# Patient Record
Sex: Female | Born: 1995 | Race: Black or African American | Hispanic: No | Marital: Single
Health system: Southern US, Community
[De-identification: ages and names within clinical notes are randomized; demographics above are authoritative.]

---

## 2018-12-21 ENCOUNTER — Emergency Department (HOSPITAL_COMMUNITY): Payer: PRIVATE HEALTH INSURANCE

## 2018-12-21 ENCOUNTER — Emergency Department (HOSPITAL_COMMUNITY)
Admission: EM | Admit: 2018-12-21 | Discharge: 2018-12-21 | Disposition: A | Discharge status: Home / Self Care | Payer: PRIVATE HEALTH INSURANCE | Attending: Emergency Medicine | Admitting: Emergency Medicine

## 2018-12-21 ENCOUNTER — Other Ambulatory Visit: Payer: Self-pay

## 2018-12-21 DIAGNOSIS — Y9389 Activity, other specified: Secondary | ICD-10-CM | POA: Diagnosis not present

## 2018-12-21 DIAGNOSIS — Y999 Unspecified external cause status: Secondary | ICD-10-CM | POA: Insufficient documentation

## 2018-12-21 DIAGNOSIS — Z23 Encounter for immunization: Secondary | ICD-10-CM | POA: Insufficient documentation

## 2018-12-21 DIAGNOSIS — S0242XA Fracture of alveolus of maxilla, initial encounter for closed fracture: Secondary | ICD-10-CM | POA: Insufficient documentation

## 2018-12-21 DIAGNOSIS — F10929 Alcohol use, unspecified with intoxication, unspecified: Secondary | ICD-10-CM | POA: Insufficient documentation

## 2018-12-21 DIAGNOSIS — Y92410 Unspecified street and highway as the place of occurrence of the external cause: Secondary | ICD-10-CM | POA: Diagnosis not present

## 2018-12-21 DIAGNOSIS — S0990XA Unspecified injury of head, initial encounter: Secondary | ICD-10-CM

## 2018-12-21 DIAGNOSIS — Y904 Blood alcohol level of 80-99 mg/100 ml: Secondary | ICD-10-CM | POA: Diagnosis not present

## 2018-12-21 DIAGNOSIS — T1490XA Injury, unspecified, initial encounter: Secondary | ICD-10-CM

## 2018-12-21 LAB — I-STAT BETA HCG BLOOD, ED (MC, WL, AP ONLY): I-stat hCG, quantitative: <5 m[IU]/mL (ref <5)

## 2018-12-21 LAB — COMPREHENSIVE METABOLIC PANEL
ALT: 14 U/L (ref 0–44)
AST: 23 U/L (ref 15–41)
Albumin: 4.2 g/dL (ref 3.5–5.0)
Alkaline Phosphatase: 81 U/L (ref 38–126)
Anion gap: 12 (ref 5–15)
BUN: 13 mg/dL (ref 6–20)
CO2: 19 mmol/L — ABNORMAL LOW (ref 22–32)
Calcium: 9.1 mg/dL (ref 8.9–10.3)
Chloride: 108 mmol/L (ref 98–111)
Creatinine, Ser: 0.7 mg/dL (ref 0.44–1.00)
GFR calc Af Amer: >60 mL/min (ref >60)
GFR calc non Af Amer: >60 mL/min (ref >60)
Glucose, Bld: 96 mg/dL (ref 70–99)
Potassium: 3.2 mmol/L — ABNORMAL LOW (ref 3.5–5.1)
Sodium: 139 mmol/L (ref 135–145)
Total Bilirubin: <0.1 mg/dL — ABNORMAL LOW (ref 0.3–1.2)
Total Protein: 7.2 g/dL (ref 6.5–8.1)

## 2018-12-21 LAB — LACTIC ACID, PLASMA: Lactic Acid, Venous: 3.5 mmol/L (ref 0.5–1.9)

## 2018-12-21 LAB — PROTIME-INR
INR: 1 (ref 0.8–1.2)
Prothrombin Time: 13.2 seconds (ref 11.4–15.2)

## 2018-12-21 LAB — CBC
HCT: 32 % — ABNORMAL LOW (ref 36.0–46.0)
Hemoglobin: 9.8 g/dL — ABNORMAL LOW (ref 12.0–15.0)
MCH: 27.1 pg (ref 26.0–34.0)
MCHC: 30.6 g/dL (ref 30.0–36.0)
MCV: 88.6 fL (ref 80.0–100.0)
Platelets: 311 10*3/uL (ref 150–400)
RBC: 3.61 MIL/uL — ABNORMAL LOW (ref 3.87–5.11)
RDW: 16.6 % — ABNORMAL HIGH (ref 11.5–15.5)
WBC: 12.9 10*3/uL — ABNORMAL HIGH (ref 4.0–10.5)
nRBC: 0 % (ref 0.0–0.2)

## 2018-12-21 LAB — CDS SEROLOGY

## 2018-12-21 LAB — I-STAT CHEM 8, ED
BUN: 14 mg/dL (ref 6–20)
Calcium, Ion: 1.19 mmol/L (ref 1.15–1.40)
Chloride: 106 mmol/L (ref 98–111)
Creatinine, Ser: 0.8 mg/dL (ref 0.44–1.00)
Glucose, Bld: 96 mg/dL (ref 70–99)
HCT: 34 % — ABNORMAL LOW (ref 36.0–46.0)
Hemoglobin: 11.6 g/dL — ABNORMAL LOW (ref 12.0–15.0)
Potassium: 3.1 mmol/L — ABNORMAL LOW (ref 3.5–5.1)
Sodium: 142 mmol/L (ref 135–145)
TCO2: 21 mmol/L — ABNORMAL LOW (ref 22–32)

## 2018-12-21 LAB — ETHANOL: Alcohol, Ethyl (B): 91 mg/dL — ABNORMAL HIGH (ref <10)

## 2018-12-21 LAB — SAMPLE TO BLOOD BANK

## 2018-12-21 MED ORDER — OXYCODONE-ACETAMINOPHEN 5-325 MG PO TABS: 1.0000 | ORAL_TABLET | ORAL | 0 refills | Status: DC | PRN

## 2018-12-21 MED ORDER — SODIUM CHLORIDE 0.9 % IV SOLN: INTRAVENOUS | Status: DC

## 2018-12-21 MED ORDER — AMOXICILLIN-POT CLAVULANATE 875-125 MG PO TABS: 1.0000 | ORAL_TABLET | Freq: Two times a day (BID) | ORAL | 0 refills | Status: AC

## 2018-12-21 MED ORDER — SODIUM CHLORIDE 0.9 % IV SOLN
INTRAVENOUS | Status: AC | PRN
  Administered 2018-12-21: 1000 mL via INTRAVENOUS

## 2018-12-21 MED ORDER — FENTANYL CITRATE (PF) 100 MCG/2ML IJ SOLN: 50.0000 ug | Freq: Once | INTRAMUSCULAR | Status: DC

## 2018-12-21 MED ORDER — IOHEXOL 350 MG/ML SOLN
100.0000 mL | Freq: Once | INTRAVENOUS | Status: AC | PRN
  Administered 2018-12-21: 100 mL via INTRAVENOUS

## 2018-12-21 MED ORDER — CHLORHEXIDINE GLUCONATE 0.12 % MT SOLN: 15.0000 mL | Freq: Two times a day (BID) | OROMUCOSAL | 0 refills | Status: AC

## 2018-12-21 MED ORDER — OXYCODONE-ACETAMINOPHEN 5-325 MG PO TABS: 1.0000 | ORAL_TABLET | ORAL | 0 refills | Status: AC | PRN

## 2018-12-21 MED ORDER — SODIUM CHLORIDE 0.9 % IV BOLUS (SEPSIS)
1000.0000 mL | Freq: Once | INTRAVENOUS | Status: AC
  Administered 2018-12-21: 1000 mL via INTRAVENOUS

## 2018-12-21 MED ORDER — TETANUS-DIPHTH-ACELL PERTUSSIS 5-2.5-18.5 LF-MCG/0.5 IM SUSP
0.5000 mL | Freq: Once | INTRAMUSCULAR | Status: AC
  Administered 2018-12-21: 0.5 mL via INTRAMUSCULAR
  Filled 2018-12-21: qty 0.5

## 2018-12-21 MED ORDER — ONDANSETRON 4 MG PO TBDP: 4.0000 mg | ORAL_TABLET | Freq: Four times a day (QID) | ORAL | 0 refills | Status: AC | PRN

## 2018-12-21 MED ORDER — AMOXICILLIN-POT CLAVULANATE 875-125 MG PO TABS
1.0000 | ORAL_TABLET | Freq: Once | ORAL | Status: AC
  Administered 2018-12-21: 1 via ORAL
  Filled 2018-12-21: qty 1

## 2018-12-21 NOTE — ED Notes (Signed)
Mouth wounds cleaned, foot wounds cleaned and covered per provider.
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
## 2018-12-21 NOTE — ED Notes (Signed)
Pt able to walk and tolerate PO fluids.  Provider aware.

## 2018-12-21 NOTE — ED Notes (Signed)
LEO questioning pt.
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
## 2018-12-21 NOTE — ED Notes (Signed)
O2 level decreased to 87%, placed on 2L [place]

## 2018-12-21 NOTE — ED Notes (Signed)
Called Dr. Leonides Schanz who verbally gave permission to go ahead w/ scans before preg test comes back.

## 2018-12-21 NOTE — Discharge Instructions (Addendum)
You have a fracture of your maxillary ridge and dental injury to the 2 upper teeth.  The tooth that came in with you with EMS is fractured and cannot be replaced in the ED.  You will need to follow-up with a dentist.  I recommend a soft diet for the next 3 to 4 days.  I recommend close follow-up as an outpatient and to continue her antibiotics for 1 week until complete.

## 2018-12-21 NOTE — ED Notes (Signed)
Called CT, informed Provider wanted pt scanned STAT, not waiting for labs to come back.  Pt to CT 2 now.

## 2020-02-01 ENCOUNTER — Ambulatory Visit: Payer: No Typology Code available for payment source | Attending: Chiropractic Medicine | Admitting: Physical Therapy

## 2020-02-01 ENCOUNTER — Other Ambulatory Visit: Payer: Self-pay

## 2020-02-01 ENCOUNTER — Encounter: Payer: Self-pay | Admitting: Physical Therapy

## 2020-02-01 DIAGNOSIS — M545 Low back pain, unspecified: Secondary | ICD-10-CM | POA: Insufficient documentation

## 2020-02-01 DIAGNOSIS — M62830 Muscle spasm of back: Secondary | ICD-10-CM | POA: Insufficient documentation

## 2020-02-01 NOTE — Patient Instructions (Signed)
Access Code: [email or web address] URL: [email or web address] Date: 02/01/2020 Prepared by: Stacie Glaze  Exercises Supine Lower Trunk Rotation - 2 x daily - 7 x weekly - 1 sets - 5 reps - 10 hold Supine Piriformis Stretch with Foot on Ground - 2 x daily - 7 x weekly - 1 sets - 5 reps - 10 hold Supine Piriformis Stretch Pulling Heel to Hip - 2 x daily - 7 x weekly - 1 sets - 5 reps - 10 hold Hooklying Hamstring Stretch with Strap - 2 x daily - 7 x weekly - 1 sets - 5 reps - 10 hold
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
## 2020-02-06 ENCOUNTER — Ambulatory Visit: Payer: No Typology Code available for payment source | Admitting: Physical Therapy

## 2020-02-06 ENCOUNTER — Encounter: Payer: Self-pay | Admitting: Physical Therapy

## 2020-02-06 ENCOUNTER — Other Ambulatory Visit: Payer: Self-pay

## 2020-02-06 DIAGNOSIS — M545 Low back pain, unspecified: Secondary | ICD-10-CM

## 2020-02-06 DIAGNOSIS — M62830 Muscle spasm of back: Secondary | ICD-10-CM

## 2020-02-06 NOTE — Therapy (Signed)
[facility] [place] [facility] [address] [place], [place], [address] Phone: [phone]   Fax:  [phone]  Physical Therapy Treatment  Patient Details  Name: Alexandra Barton MRN: [id number] Date of Birth: April 26, 1995 Referring Provider (PT): Mauri Reading   Encounter Date: 02/06/2020   PT End of Session - 02/06/20 1506    Visit Number 2    Date for PT Re-Evaluation 04/04/19    PT Start Time 1430    PT Stop Time 1510    PT Time Calculation (min) 40 min    Activity Tolerance Patient tolerated treatment well    Behavior During Therapy [facility] for tasks assessed/performed           History reviewed. No pertinent past medical history.  History reviewed. No pertinent surgical history.  There were no vitals filed for this visit.   Subjective Assessment - 02/06/20 1431    Subjective Doing fine today, no pain    Currently in Pain? No/denies                             [facility] Adult PT Treatment/Exercise - 02/06/20 0001      Exercises   Exercises Lumbar      Lumbar Exercises: Stretches   Passive Hamstring Stretch Right;Left;4 reps;10 seconds    Single Knee to Chest Stretch 4 reps;10 seconds;20 seconds;Right;Left    Lower Trunk Rotation 4 reps;10 seconds      Lumbar Exercises: Aerobic   Nustep L4 x6 min      Lumbar Exercises: Machines for Strengthening   Cybex Knee Extension 5lb 2x10    Cybex Knee Flexion 25lb 2x10      Lumbar Exercises: Standing   Row Theraband;20 reps;Both;Strengthening    Theraband Level (Row) Level 3 (Green)    Shoulder Extension Theraband;20 reps;Both;Strengthening    Theraband Level (Shoulder Extension) Level 3 (Green)    Other Standing Lumbar Exercises ER red 2x10                    PT Short Term Goals - 02/06/20 1509      PT SHORT TERM GOAL #1   Title independent with initial HEP    Status Achieved             PT Long Term Goals - 02/06/20 1509      PT LONG TERM GOAL #1    Title understand posture and body mechanics    Status On-going                 Plan - 02/06/20 1506    Clinical Impression Statement Pt tolerated an initial progression to TE well. Cues needed to complete the full ROM with seated curls and extensions. Tactile cues for posture needed with standing shoulder ext and rows. She reported an increase in low back discomfort when doing repetitive standing interventions. This low back discomfort went away after stretching.    Stability/Clinical Decision Making Stable/Uncomplicated    Rehab Potential Good    PT Frequency 2x / week    PT Duration 8 weeks    PT Treatment/Interventions ADLs/Self Care Home Management;Electrical Stimulation;Moist Heat;Neuromuscular re-education;Therapeutic exercise;Therapeutic activities;Patient/family education;Manual techniques;Dry needling    PT Next Visit Plan start gym activities for strength and flexibility           Patient will benefit from skilled therapeutic intervention in order to improve the following deficits and impairments:  Decreased range of motion,Pain,Impaired flexibility,Improper body  mechanics,Postural dysfunction,Decreased strength,Difficulty walking,Increased muscle spasms  Visit Diagnosis: Acute bilateral low back pain without sciatica  Muscle spasm of back     Problem List There are no problems to display for this patient.   Grayce Sessions, PTA 02/06/2020, 3:09 PM  [facility] [place] [facility] [address] [place], [place], [address] Phone: [phone]   Fax:  [phone]  Name: Alexandra Barton MRN: [id number] Date of Birth: [birth year]

## 2020-02-08 ENCOUNTER — Ambulatory Visit: Payer: No Typology Code available for payment source | Admitting: Physical Therapy

## 2020-02-08 ENCOUNTER — Other Ambulatory Visit: Payer: Self-pay

## 2020-02-08 ENCOUNTER — Encounter: Payer: Self-pay | Admitting: Physical Therapy

## 2020-02-08 DIAGNOSIS — M545 Low back pain, unspecified: Secondary | ICD-10-CM | POA: Diagnosis not present

## 2020-02-08 DIAGNOSIS — M62830 Muscle spasm of back: Secondary | ICD-10-CM

## 2020-02-08 NOTE — Therapy (Signed)
[facility] [address] [place], [place], [address] Phone: [phone]   Fax:  [phone]  Physical Therapy Treatment  Patient Details  Name: Keslee Harrington MRN: [id number] Date of Birth: May 25, 1995 Referring Provider (PT): Mauri Reading   Encounter Date: 02/08/2020   PT End of Session - 02/08/20 1506    Visit Number 3    Date for PT Re-Evaluation 04/04/19    PT Start Time 1430    PT Stop Time 1518    PT Time Calculation (min) 48 min    Activity Tolerance Patient tolerated treatment well    Behavior During Therapy [facility] for tasks assessed/performed           History reviewed. No pertinent past medical history.  History reviewed. No pertinent surgical history.  There were no vitals filed for this visit.   Subjective Assessment - 02/08/20 1432    Subjective Back was hurting yesterday, it feels ok    Currently in Pain? No/denies                             [facility] Adult PT Treatment/Exercise - 02/08/20 0001      Lumbar Exercises: Aerobic   UBE (Upper Arm Bike) L1 x 3 min    Nustep L4 x6 min      Lumbar Exercises: Machines for Strengthening   Other Lumbar Machine Exercise Rows & Lats 20lb 2x10      Lumbar Exercises: Standing   Other Standing Lumbar Exercises Shoulder Ext 5lb 2x10    Other Standing Lumbar Exercises standing marches 2x10      Lumbar Exercises: Seated   Sit to Stand 10 reps   chest press yellow ball x2     Modalities   Modalities Moist Heat      Moist Heat Therapy   Number Minutes Moist Heat 10 Minutes    Moist Heat Location Lumbar Spine                    PT Short Term Goals - 02/06/20 1509      PT SHORT TERM GOAL #1   Title independent with initial HEP    Status Achieved             PT Long Term Goals - 02/06/20 1509      PT LONG TERM GOAL #1   Title understand posture and body mechanics    Status On-going                 Plan - 02/08/20 1506     Clinical Impression Statement Progressed to some additional postural strengthening interventions. She did require tactile cues to prevent postural sway with seated rows. Cue to drive elbows down to her side with lat pull downs. Increase low back tightness reported with standing shoulder extensions. MHP to help with low back tightness.    Stability/Clinical Decision Making Stable/Uncomplicated    Rehab Potential Good    PT Frequency 2x / week    PT Duration 8 weeks    PT Treatment/Interventions ADLs/Self Care Home Management;Electrical Stimulation;Moist Heat;Neuromuscular re-education;Therapeutic exercise;Therapeutic activities;Patient/family education;Manual techniques;Dry needling    PT Next Visit Plan gym activities for strength and flexibility           Patient will benefit from skilled therapeutic intervention in order to improve the following deficits and impairments:  Decreased range of motion,Pain,Impaired flexibility,Improper body mechanics,Postural dysfunction,Decreased strength,Difficulty walking,Increased muscle spasms  Visit Diagnosis:  Muscle spasm of back  Acute bilateral low back pain without sciatica     Problem List There are no problems to display for this patient.   Grayce Sessions, PTA 02/08/2020, 3:09 PM  [facility] [place] [facility] [address] [place], [place], [address] Phone: [phone]   Fax:  [phone]  Name: Ertha Nabor MRN: [id number] Date of Birth: 1995/05/18

## 2020-02-13 ENCOUNTER — Encounter: Payer: Self-pay | Admitting: Physical Therapy

## 2020-02-13 ENCOUNTER — Other Ambulatory Visit: Payer: Self-pay

## 2020-02-13 ENCOUNTER — Ambulatory Visit: Payer: No Typology Code available for payment source | Admitting: Physical Therapy

## 2020-02-13 DIAGNOSIS — M545 Low back pain, unspecified: Secondary | ICD-10-CM | POA: Diagnosis not present

## 2020-02-13 DIAGNOSIS — M62830 Muscle spasm of back: Secondary | ICD-10-CM

## 2020-02-13 NOTE — Therapy (Signed)
[place]. [place], [place], [address] Phone: [phone]   Fax:  [phone]  Physical Therapy Treatment  Patient Details  Name: Alexandra Barton MRN: [id number] Date of Birth: 1995/08/31 Referring Provider (PT): Berta Minor   Encounter Date: 02/13/2020   PT End of Session - 02/13/20 1512    Visit Number 4    Date for PT Re-Evaluation 04/04/19    PT Start Time 1430    PT Stop Time 1513    PT Time Calculation (min) 43 min    Activity Tolerance Patient tolerated treatment well    Behavior During Therapy [facility] for tasks assessed/performed           History reviewed. No pertinent past medical history.  History reviewed. No pertinent surgical history.  There were no vitals filed for this visit.   Subjective Assessment - 02/13/20 1427    Subjective "Ok, my back hurt"    Currently in Pain? Yes    Pain Score 4     Pain Location Back                             [facility] Adult PT Treatment/Exercise - 02/13/20 0001      Lumbar Exercises: Stretches   Active Hamstring Stretch Right;Left;4 reps;10 seconds;2 reps;3 reps      Lumbar Exercises: Aerobic   UBE (Upper Arm Bike) L1 x 3 min    Nustep L4 x6 min      Lumbar Exercises: Machines for Strengthening   Other Lumbar Machine Exercise Rows & Lats 20lb 2x10      Lumbar Exercises: Standing   Shoulder Extension 20 reps;Both;Strengthening    Shoulder Extension Limitations 5    Other Standing Lumbar Exercises overhead Ext red ball 2x10    Other Standing Lumbar Exercises ER red 2x10; hip abd & Ext 2lb 2x10                    PT Short Term Goals - 02/06/20 1509      PT SHORT TERM GOAL #1   Title independent with initial HEP    Status Achieved             PT Long Term Goals - 02/13/20 1513      PT LONG TERM GOAL #1   Title understand posture and body mechanics    Status Partially Met      PT LONG TERM GOAL #2   Title increase  lumbar side bending to WNL's    Status On-going      PT LONG TERM GOAL #3   Title pain decreased 50%    Status On-going                 Plan - 02/13/20 1513    Clinical Impression Statement Pt able to complete today's interventions. She did report some increase in low back pain with standing hip extensions. Increase UE fatigue noted throughout session, pt would often stop during sets before completing all the reps. No issues with the addition of over head extension. R hamstring is tighter than L evident bu decrease ROM with active stretching.    Rehab Potential Good    PT Frequency 2x / week    PT Duration 8 weeks    PT Treatment/Interventions ADLs/Self Care Home Management;Electrical Stimulation;Moist Heat;Neuromuscular re-education;Therapeutic exercise;Therapeutic activities;Patient/family education;Manual techniques;Dry needling    PT Next Visit Plan gym activities for strength and  flexibility           Patient will benefit from skilled therapeutic intervention in order to improve the following deficits and impairments:  Decreased range of motion,Pain,Impaired flexibility,Improper body mechanics,Postural dysfunction,Decreased strength,Difficulty walking,Increased muscle spasms  Visit Diagnosis: Acute bilateral low back pain without sciatica  Muscle spasm of back     Problem List There are no problems to display for this patient.   Scot Jun, PTA 02/13/2020, 3:17 PM  [address]. [place], [place], [address] Phone: [phone]   Fax:  [phone]  Name: Alexandra Barton MRN: [id number] Date of Birth: March 02, 1995

## 2020-02-15 ENCOUNTER — Encounter: Payer: Self-pay | Admitting: Physical Therapy

## 2020-02-15 ENCOUNTER — Other Ambulatory Visit: Payer: Self-pay

## 2020-02-15 ENCOUNTER — Ambulatory Visit: Payer: No Typology Code available for payment source | Admitting: Physical Therapy

## 2020-02-15 DIAGNOSIS — M545 Low back pain, unspecified: Secondary | ICD-10-CM

## 2020-02-15 DIAGNOSIS — M62830 Muscle spasm of back: Secondary | ICD-10-CM

## 2020-02-15 NOTE — Therapy (Signed)
[facility] [place] [address] [place], [place], [address] Phone: [phone]   Fax:  [phone]  Physical Therapy Treatment  Patient Details  Name: Alexandra Barton MRN: [id number] Date of Birth: October 14, 1995 Referring Provider (PT): Mauri Reading   Encounter Date: 02/15/2020   PT End of Session - 02/15/20 1505    Visit Number 5    Date for PT Re-Evaluation 04/04/19    PT Start Time 1433    PT Stop Time 1515    PT Time Calculation (min) 42 min    Activity Tolerance Patient tolerated treatment well    Behavior During Therapy [facility] for tasks assessed/performed           History reviewed. No pertinent past medical history.  History reviewed. No pertinent surgical history.  There were no vitals filed for this visit.   Subjective Assessment - 02/15/20 1440    Subjective My upper back and neck area was really hurting may be the way I slept    Currently in Pain? Yes    Pain Score 5     Pain Location Back    Pain Orientation Upper    Pain Descriptors / Indicators Aching;Sore    Aggravating Factors  sleeping wrong                             [facility] Adult PT Treatment/Exercise - 02/15/20 0001      Lumbar Exercises: Aerobic   UBE (Upper Arm Bike) L2 x 4 min    Nustep L4 x6 min      Lumbar Exercises: Machines for Strengthening   Cybex Knee Extension 5lb 2x10    Cybex Knee Flexion 25lb 2x10    Other Lumbar Machine Exercise Rows & Lats 20lb 2x10      Lumbar Exercises: Standing   Row Theraband;20 reps;Both;Strengthening    Theraband Level (Row) Level 3 (Green)    Shoulder Extension 20 reps;Both;Strengthening    Theraband Level (Shoulder Extension) Level 3 (Green)    Other Standing Lumbar Exercises ER red 2x10; hip abd & Ext 2lb 2x10      Lumbar Exercises: Seated   Other Seated Lumbar Exercises on sit fit pelvic motbility and stabilty                    PT Short Term Goals - 02/06/20 1509      PT SHORT  TERM GOAL #1   Title independent with initial HEP    Status Achieved             PT Long Term Goals - 02/15/20 1508      PT LONG TERM GOAL #2   Title increase lumbar side bending to WNL's    Status On-going      PT LONG TERM GOAL #3   Title pain decreased 50%    Status On-going                 Plan - 02/15/20 1506    Clinical Impression Statement Patient with some incresae of low and upper back pain, with the exercises today gave a lot of cues for posture, form of the exercises and to slow down and get good repetitions.  She was tender in the upper traps and the rhomboid area today.    PT Next Visit Plan continue to add exercises for stability and flexibilty    Consulted and Agree with Plan of Care Patient  Patient will benefit from skilled therapeutic intervention in order to improve the following deficits and impairments:  Decreased range of motion,Pain,Impaired flexibility,Improper body mechanics,Postural dysfunction,Decreased strength,Difficulty walking,Increased muscle spasms  Visit Diagnosis: Acute bilateral low back pain without sciatica  Muscle spasm of back     Problem List There are no problems to display for this patient.   Jearld Lesch., PT 02/15/2020, 3:09 PM  [facility] [place] [address] [place], [place], [address] Phone: [phone]   Fax:  [phone]  Name: Alexandra Barton MRN: [id number] Date of Birth: [birth year]

## 2020-02-20 ENCOUNTER — Encounter: Payer: Self-pay | Admitting: Physical Therapy

## 2020-02-20 ENCOUNTER — Ambulatory Visit: Payer: No Typology Code available for payment source | Admitting: Physical Therapy

## 2020-02-20 ENCOUNTER — Other Ambulatory Visit: Payer: Self-pay

## 2020-02-20 DIAGNOSIS — M545 Low back pain, unspecified: Secondary | ICD-10-CM

## 2020-02-20 DIAGNOSIS — M62830 Muscle spasm of back: Secondary | ICD-10-CM

## 2020-02-20 NOTE — Therapy (Signed)
[facility] [place] [facility] [address]. [place], [place], [address] Phone: [phone]   Fax:  [phone]  Physical Therapy Treatment  Patient Details  Name: Alexandra Barton MRN: [id number] Date of Birth: 08-Oct-1995 Referring Provider (PT): Mauri Reading   Encounter Date: 02/20/2020   PT End of Session - 02/20/20 1508    Visit Number 6    Date for PT Re-Evaluation 04/04/19    PT Start Time 1430    PT Stop Time 1511    PT Time Calculation (min) 41 min    Activity Tolerance Patient tolerated treatment well    Behavior During Therapy [facility] for tasks assessed/performed           History reviewed. No pertinent past medical history.  History reviewed. No pertinent surgical history.  There were no vitals filed for this visit.   Subjective Assessment - 02/20/20 1432    Subjective Pt reports upper back pain    Currently in Pain? Yes    Pain Score 5     Pain Location Back    Pain Orientation Upper                             [facility] Adult PT Treatment/Exercise - 02/20/20 0001      Lumbar Exercises: Aerobic   UBE (Upper Arm Bike) L2 x 4 min    Nustep L4 x6 min      Lumbar Exercises: Machines for Strengthening   Cybex Knee Extension 5lb 2x10    Cybex Knee Flexion 25lb 2x10    Other Lumbar Machine Exercise Rows & Lats 25lb 2x10      Lumbar Exercises: Standing   Row Theraband;20 reps;Both;Strengthening    Theraband Level (Row) Level 3 (Green)    Shoulder Extension 20 reps;Both;Strengthening    Theraband Level (Shoulder Extension) Level 3 (Green)                    PT Short Term Goals - 02/06/20 1509      PT SHORT TERM GOAL #1   Title independent with initial HEP    Status Achieved             PT Long Term Goals - 02/15/20 1508      PT LONG TERM GOAL #2   Title increase lumbar side bending to WNL's    Status On-going      PT LONG TERM GOAL #3   Title pain decreased 50%    Status On-going                  Plan - 02/20/20 1510    Clinical Impression Statement All interventions completed well, but with signs of fatigue throughout. Pt enters clinic reporting upper back pain today. Cues required for posture and to complete the full avaliable ROM with the exercises. Some increase in mid back pain with shoulder extensions.    Stability/Clinical Decision Making Stable/Uncomplicated    Rehab Potential Good    PT Frequency 2x / week    PT Duration 8 weeks    PT Treatment/Interventions ADLs/Self Care Home Management;Electrical Stimulation;Moist Heat;Neuromuscular re-education;Therapeutic exercise;Therapeutic activities;Patient/family education;Manual techniques;Dry needling    PT Next Visit Plan continue to add exercises for stability and flexibility           Patient will benefit from skilled therapeutic intervention in order to improve the following deficits and impairments:  Decreased range of motion,Pain,Impaired flexibility,Improper body mechanics,Postural dysfunction,Decreased strength,Difficulty  walking,Increased muscle spasms  Visit Diagnosis: Muscle spasm of back  Acute bilateral low back pain without sciatica     Problem List There are no problems to display for this patient.   Grayce Sessions, PTA 02/20/2020, 3:14 PM  [facility] [place] [facility] [address] [place], [place], [address] Phone: [phone]   Fax:  [phone]  Name: Alexandra Barton MRN: [id number] Date of Birth: 1996-01-03

## 2020-02-22 ENCOUNTER — Encounter: Payer: Self-pay | Admitting: Physical Therapy

## 2020-02-22 ENCOUNTER — Ambulatory Visit: Payer: No Typology Code available for payment source | Admitting: Physical Therapy

## 2020-02-22 ENCOUNTER — Other Ambulatory Visit: Payer: Self-pay

## 2020-02-22 DIAGNOSIS — M545 Low back pain, unspecified: Secondary | ICD-10-CM | POA: Diagnosis not present

## 2020-02-22 DIAGNOSIS — M62830 Muscle spasm of back: Secondary | ICD-10-CM

## 2020-02-22 NOTE — Therapy (Signed)
[facility] [place] [address] [place], [place], [address] Phone: [phone]   Fax:  [phone]  Physical Therapy Treatment  Patient Details  Name: Lennon Richins MRN: [id number] Date of Birth: May 17, 1995 Referring Provider (PT): Mauri Reading   Encounter Date: 02/22/2020   PT End of Session - 02/22/20 1503    Visit Number 7    Date for PT Re-Evaluation 04/04/19    PT Start Time 1430    PT Stop Time 1503    PT Time Calculation (min) 33 min    Activity Tolerance Patient tolerated treatment well    Behavior During Therapy [facility] for tasks assessed/performed           History reviewed. No pertinent past medical history.  History reviewed. No pertinent surgical history.  There were no vitals filed for this visit.   Subjective Assessment - 02/22/20 1431    Subjective "Tired, stayed up late last night"    Currently in Pain? No/denies                             [facility] Adult PT Treatment/Exercise - 02/22/20 0001      Lumbar Exercises: Aerobic   UBE (Upper Arm Bike) L2 x 4 min    Nustep L4 x6 min      Lumbar Exercises: Machines for Strengthening   Other Lumbar Machine Exercise Rows & Lats 25lb 2x10, Chest press 10lb 2x10      Manual Therapy   Manual Therapy Passive ROM;Soft tissue mobilization    Manual therapy comments some tenderness in rhomboid area    Soft tissue mobilization Upper traps into rhomboid area    Passive ROM Cervical spine with end range stretching                    PT Short Term Goals - 02/06/20 1509      PT SHORT TERM GOAL #1   Title independent with initial HEP    Status Achieved             PT Long Term Goals - 02/15/20 1508      PT LONG TERM GOAL #2   Title increase lumbar side bending to WNL's    Status On-going      PT LONG TERM GOAL #3   Title pain decreased 50%    Status On-going                 Plan - 02/22/20 1503    Clinical Impression  Statement Pt did well with posterior chain strengthening. Reports some pain in the rhomboid area with chest press. Cues to keep shoulder blades down and back when pressing and this helped with pain. Pt had a positive response to MT, evident by increase tissues elasticity and decrease pain.    Stability/Clinical Decision Making Stable/Uncomplicated    Rehab Potential Good    PT Frequency 2x / week    PT Duration 8 weeks    PT Next Visit Plan assess MT, continue to add exercises for stability and flexibilty           Patient will benefit from skilled therapeutic intervention in order to improve the following deficits and impairments:  Decreased range of motion,Pain,Impaired flexibility,Improper body mechanics,Postural dysfunction,Decreased strength,Difficulty walking,Increased muscle spasms  Visit Diagnosis: Muscle spasm of back     Problem List There are no problems to display for this patient.   Grayce Sessions,  PTA 02/22/2020, 3:06 PM  [facility] [place] [facility] [address] [place], [place], [address] Phone: [phone]   Fax:  [phone]  Name: Sharlon Pfohl MRN: [id number] Date of Birth: 1995-05-02

## 2020-02-27 ENCOUNTER — Encounter: Payer: Self-pay | Admitting: Physical Therapy

## 2020-02-27 ENCOUNTER — Other Ambulatory Visit: Payer: Self-pay

## 2020-02-27 ENCOUNTER — Ambulatory Visit: Payer: PRIVATE HEALTH INSURANCE | Attending: Chiropractic Medicine | Admitting: Physical Therapy

## 2020-02-27 DIAGNOSIS — M62830 Muscle spasm of back: Secondary | ICD-10-CM | POA: Insufficient documentation

## 2020-02-27 DIAGNOSIS — M545 Low back pain, unspecified: Secondary | ICD-10-CM | POA: Insufficient documentation

## 2020-02-27 NOTE — Therapy (Signed)
[facility] [place] [facility] [address] [place], [place], [address] Phone: [phone]   Fax:  [phone]  Physical Therapy Treatment  Patient Details  Name: Alexandra Barton MRN: [id number] Date of Birth: [birth year] Referring Provider (PT): Mauri Reading   Encounter Date: 02/27/2020   PT End of Session - 02/27/20 1507    Visit Number 8    Date for PT Re-Evaluation 04/04/19    PT Start Time 1437    PT Stop Time 1512    PT Time Calculation (min) 35 min    Activity Tolerance Patient tolerated treatment well    Behavior During Therapy [facility] for tasks assessed/performed           History reviewed. No pertinent past medical history.  History reviewed. No pertinent surgical history.  There were no vitals filed for this visit.   Subjective Assessment - 02/27/20 1437    Subjective "I am ok, my arm hurt but that is because I got the vaccine yesterday"    Currently in Pain? No/denies              [facility] PT Assessment - 02/27/20 0001      AROM   Overall AROM Comments Lumbar ROM WNL's                         [facility] Adult PT Treatment/Exercise - 02/27/20 0001      Lumbar Exercises: Aerobic   Nustep L5 x6 min      Lumbar Exercises: Machines for Strengthening   Cybex Knee Extension 10lb 2x15    Cybex Knee Flexion 25lb 2x15    Leg Press 20lb 2x10    Other Lumbar Machine Exercise Rows & Lats 25lb 2x10      Lumbar Exercises: Standing   Shoulder Extension 20 reps;Both;Strengthening    Shoulder Extension Limitations 5                    PT Short Term Goals - 02/06/20 1509      PT SHORT TERM GOAL #1   Title independent with initial HEP    Status Achieved             PT Long Term Goals - 02/27/20 1456      PT LONG TERM GOAL #2   Title increase lumbar side bending to WNL's    Status Achieved                 Plan - 02/27/20 1508    Clinical Impression Statement Pt has progressed increasing her lumbar  ROM completing goal. She enters clinic reporting less pain overall. increase reports and or resistance tolerated with machine level interventions. Added leg pres without issue. Some minor LBP increase with lat pull downs.    Stability/Clinical Decision Making Stable/Uncomplicated    Rehab Potential Good    PT Frequency 2x / week    PT Duration 8 weeks    PT Treatment/Interventions ADLs/Self Care Home Management;Electrical Stimulation;Moist Heat;Neuromuscular re-education;Therapeutic exercise;Therapeutic activities;Patient/family education;Manual techniques;Dry needling    PT Next Visit Plan continue to add exercises for stability and flexibilty           Patient will benefit from skilled therapeutic intervention in order to improve the following deficits and impairments:  Decreased range of motion,Pain,Impaired flexibility,Improper body mechanics,Postural dysfunction,Decreased strength,Difficulty walking,Increased muscle spasms  Visit Diagnosis: Muscle spasm of back  Acute bilateral low back pain without sciatica     Problem List There  are no problems to display for this patient.   Grayce Sessions, PTA 02/27/2020, 3:10 PM  [facility] [place] [place] [facility] [address]. [place], [place], [address] Phone: [phone]   Fax:  [phone]  Name: Alexandra Barton MRN: [id number] Date of Birth: [birth year]

## 2020-02-29 ENCOUNTER — Other Ambulatory Visit: Payer: Self-pay

## 2020-02-29 ENCOUNTER — Ambulatory Visit: Payer: PRIVATE HEALTH INSURANCE | Admitting: Physical Therapy

## 2020-02-29 ENCOUNTER — Encounter: Payer: Self-pay | Admitting: Physical Therapy

## 2020-02-29 DIAGNOSIS — M545 Low back pain, unspecified: Secondary | ICD-10-CM

## 2020-02-29 DIAGNOSIS — M62830 Muscle spasm of back: Secondary | ICD-10-CM

## 2020-02-29 NOTE — Therapy (Signed)
[place]. [place], [place], [address] Phone: [phone]   Fax:  [phone]  Physical Therapy Treatment  Patient Details  Name: Alexandra Barton MRN: [id number] Date of Birth: [birth year] Referring Provider (PT): Berta Minor   Encounter Date: 02/29/2020   PT End of Session - 02/29/20 1507    Visit Number 9    Date for PT Re-Evaluation 04/04/19    PT Start Time 1430    PT Stop Time 1522    PT Time Calculation (min) 52 min    Activity Tolerance Patient tolerated treatment well    Behavior During Therapy [facility] for tasks assessed/performed           History reviewed. No pertinent past medical history.  History reviewed. No pertinent surgical history.  There were no vitals filed for this visit.   Subjective Assessment - 02/29/20 1430    Subjective "Im all right"    Currently in Pain? No/denies                             [facility] Adult PT Treatment/Exercise - 02/29/20 0001      Lumbar Exercises: Aerobic   UBE (Upper Arm Bike) L2 x 4 min    Nustep L5 x6 min      Lumbar Exercises: Machines for Strengthening   Cybex Knee Extension 10lb 2x15    Cybex Knee Flexion 25lb 2x10    Leg Press 40lb 2x10      Lumbar Exercises: Standing   Shoulder Extension 20 reps;Both;Strengthening    Shoulder Extension Limitations 10    Other Standing Lumbar Exercises Sit to stand with OHP yellow ball 2x10      Modalities   Modalities Moist Heat;Electrical Stimulation      Electrical Stimulation   Electrical Stimulation Location Upper traps    Electrical Stimulation Action IFC    Electrical Stimulation Parameters prone    Electrical Stimulation Goals Pain                    PT Short Term Goals - 02/06/20 1509      PT SHORT TERM GOAL #1   Title independent with initial HEP    Status Achieved             PT Long Term Goals - 02/29/20 1506      PT LONG TERM GOAL #2   Title increase lumbar  side bending to WNL's    Status Achieved      PT LONG TERM GOAL #3   Title pain decreased 50%    Status Partially Met      PT LONG TERM GOAL #4   Title be able to lift groceries without pain    Status On-going                 Plan - 02/29/20 1507    Clinical Impression Statement Good carry over from last session with leg press intervention. Increase resistance tolerated with standing shoulder extension but with some increase low back tightness. She completed sit to stand with OHP without pain but with some fatigue. 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 E Stim to help with pain.    Stability/Clinical Decision Making Stable/Uncomplicated    Rehab Potential Good    PT Frequency 2x / week    PT Duration 8 weeks    PT Treatment/Interventions  ADLs/Self Care Home Management;Electrical Stimulation;Moist Heat;Neuromuscular re-education;Therapeutic exercise;Therapeutic activities;Patient/family education;Manual techniques;Dry needling    PT Next Visit Plan continue to add exercises for stability and flexibilty           Patient will benefit from skilled therapeutic intervention in order to improve the following deficits and impairments:  Decreased range of motion,Pain,Impaired flexibility,Improper body mechanics,Postural dysfunction,Decreased strength,Difficulty walking,Increased muscle spasms  Visit Diagnosis: Muscle spasm of back  Acute bilateral low back pain without sciatica     Problem List There are no problems to display for this patient.   Scot Jun, PTA 02/29/2020, 3:16 PM  [place]. [place], [place], [address] Phone: [phone]   Fax:  [phone]  Name: Alexandra Barton MRN: [id number] Date of Birth: 1995/08/12

## 2020-03-05 ENCOUNTER — Encounter: Payer: Self-pay | Admitting: Physical Therapy

## 2020-03-05 ENCOUNTER — Ambulatory Visit: Payer: PRIVATE HEALTH INSURANCE | Admitting: Physical Therapy

## 2020-03-05 ENCOUNTER — Other Ambulatory Visit: Payer: Self-pay

## 2020-03-05 DIAGNOSIS — M62830 Muscle spasm of back: Secondary | ICD-10-CM

## 2020-03-05 DIAGNOSIS — M545 Low back pain, unspecified: Secondary | ICD-10-CM

## 2020-03-05 NOTE — Therapy (Signed)
[place]. [place], [place], [address] Phone: [phone]   Fax:  [phone]  Physical Therapy Treatment  Patient Details  Name: Alexandra Barton MRN: [id number] Date of Birth: 1996/01/16 Referring Provider (PT): Berta Minor   Encounter Date: 03/05/2020   PT End of Session - 03/05/20 1418    Visit Number 10    Date for PT Re-Evaluation 04/04/19    PT Start Time 1340    PT Stop Time 1420    PT Time Calculation (min) 40 min           History reviewed. No pertinent past medical history.  History reviewed. No pertinent surgical history.  There were no vitals filed for this visit.   Subjective Assessment - 03/05/20 1345    Subjective "All right, just tired"    Currently in Pain? Yes    Pain Score 4     Pain Location Back    Pain Orientation Lower                             [facility] Adult PT Treatment/Exercise - 03/05/20 0001      Lumbar Exercises: Aerobic   UBE (Upper Arm Bike) L2 x 4 min    Nustep L5 x6 min      Lumbar Exercises: Machines for Strengthening   Cybex Lumbar Extension black band 2x10    Cybex Knee Flexion 25lb 2x10    Other Lumbar Machine Exercise Rows & Lats 25lb 2x15      Lumbar Exercises: Standing   Row Theraband;20 reps;Both;Strengthening    Theraband Level (Row) Level 4 (Blue)    Shoulder Extension 20 reps;Both;Strengthening;Theraband    Theraband Level (Shoulder Extension) Level 4 (Blue)    Other Standing Lumbar Exercises Sit to stand with OHP yellow ball 2x10                    PT Short Term Goals - 02/06/20 1509      PT SHORT TERM GOAL #1   Title independent with initial HEP    Status Achieved             PT Long Term Goals - 02/29/20 1506      PT LONG TERM GOAL #2   Title increase lumbar side bending to WNL's    Status Achieved      PT LONG TERM GOAL #3   Title pain decreased 50%    Status Partially Met      PT LONG TERM GOAL #4    Title be able to lift groceries without pain    Status On-going                 Plan - 03/05/20 1419    Clinical Impression Statement Pt did well today completing all of the interventions. increase weight tolerated with seated rows and lats. Some low back tightness reported with seated lumbar extensions. Tactile cues to squeeze shoulder blades together with standing rows. Some low back tightness reported with standing shoulder Ext.    Stability/Clinical Decision Making Stable/Uncomplicated    Rehab Potential Good    PT Frequency 2x / week    PT Duration 8 weeks    PT Treatment/Interventions ADLs/Self Care Home Management;Electrical Stimulation;Moist Heat;Neuromuscular re-education;Therapeutic exercise;Therapeutic activities;Patient/family education;Manual techniques;Dry needling    PT Next Visit Plan continue to add exercises for stability and flexibility  Patient will benefit from skilled therapeutic intervention in order to improve the following deficits and impairments:  Decreased range of motion,Pain,Impaired flexibility,Improper body mechanics,Postural dysfunction,Decreased strength,Difficulty walking,Increased muscle spasms  Visit Diagnosis: Muscle spasm of back  Acute bilateral low back pain without sciatica     Problem List There are no problems to display for this patient.   Scot Jun, PTA 03/05/2020, 2:29 PM  [place]. [place], [place], [address] Phone: [phone]   Fax:  [phone]  Name: Alexandra Barton MRN: [id number] Date of Birth: 05-20-1995

## 2020-03-07 ENCOUNTER — Ambulatory Visit: Payer: PRIVATE HEALTH INSURANCE

## 2020-03-07 ENCOUNTER — Other Ambulatory Visit: Payer: Self-pay

## 2020-03-07 DIAGNOSIS — M62830 Muscle spasm of back: Secondary | ICD-10-CM

## 2020-03-07 DIAGNOSIS — M545 Low back pain, unspecified: Secondary | ICD-10-CM

## 2020-03-07 NOTE — Patient Instructions (Signed)
Access Code: [email or web address] URL: [email or web address] Date: 03/07/2020 Prepared by: Claude Manges  Exercises Supine Posterior Pelvic Tilt - 1 x daily - 7 x weekly - 3 sets - 10 reps Supine Bridge - 1 x daily - 7 x weekly - 3 sets - 10 reps

## 2020-03-07 NOTE — Therapy (Signed)
[place]. [place], [place], [address] Phone: [phone]   Fax:  [phone]  Physical Therapy Treatment  Patient Details  Name: Alexandra Barton MRN: [id number] Date of Birth: 31-May-1995 Referring Provider (PT): Berta Minor   Encounter Date: 03/07/2020   PT End of Session - 03/07/20 5859    Visit Number 11    Date for PT Re-Evaluation 04/04/19    PT Start Time 1430    PT Stop Time 1515    PT Time Calculation (min) 45 min    Activity Tolerance Patient tolerated treatment well    Behavior During Therapy [facility] for tasks assessed/performed           No past medical history on file.  No past surgical history on file.  There were no vitals filed for this visit.   Subjective Assessment - 03/07/20 1741    Subjective Pt reports her back was spasming a bit but it is feeling okay right now.    Limitations Lifting;House hold activities              [facility] Adult PT Treatment/Exercise - 03/07/20 0001      Lumbar Exercises: Stretches   Double Knee to Chest Stretch 1 rep;20 seconds    Piriformis Stretch Left;2 reps;20 seconds   as needed after bridging 2/2 ms cramp     Lumbar Exercises: Aerobic   UBE (Upper Arm Bike) L2 x 4 min      Lumbar Exercises: Machines for Strengthening   Cybex Knee Extension 10lb 2x15   some pain reported   Cybex Knee Flexion 25lb 2x10      Lumbar Exercises: Standing   Row Theraband;20 reps;Both;Strengthening    Theraband Level (Row) Level 4 (Blue)    Shoulder Extension 20 reps;Both;Strengthening;Theraband    Theraband Level (Shoulder Extension) Level 4 (Blue)    Other Standing Lumbar Exercises wide BOS Squat <> stands to pick up 4# DB from 6 or 8 inch step 5 x 4   Demonstration provided. Max cues for alignment, hip hinge + knee bend.     Lumbar Exercises: Supine   Pelvic Tilt Limitations x10 required mod cueing to complete posterior pelvic tilt in hooklying.    Bridge Limitations 10 x 2  partial range with max verbal and tactile cues for glut/HS engagement instead of lumbar extension.      Moist Heat Therapy   Number Minutes Moist Heat 5 Minutes    Moist Heat Location Lumbar Spine      Manual Therapy   Manual therapy comments Manual intermittent lumbar traction in hooklying using sheet 8 minutes              PT Education - 03/07/20 1745    Education Details Added hook lying pelvic tilts and bridging to HEP. Educated on body mechanics for squating to pick up objects.    Person(s) Educated Patient    Methods Explanation;Demonstration;Tactile cues;Verbal cues;Handout    Comprehension Verbalized understanding;Returned demonstration            PT Short Term Goals - 02/06/20 1509      PT SHORT TERM GOAL #1   Title independent with initial HEP    Status Achieved             PT Long Term Goals - 02/29/20 1506      PT LONG TERM GOAL #2   Title increase lumbar side bending to WNL's    Status Achieved  PT LONG TERM GOAL #3   Title pain decreased 50%    Status Partially Met      PT LONG TERM GOAL #4   Title be able to lift groceries without pain    Status On-going             Plan - 03/07/20 1747    Clinical Impression Statement Alexandra Barton is making gradual progress toward goals. Pt tolerated treatment fairly today. She c/o mild pain and intermittent spasms in low back, happening especially when bending forward to lift. She has not yet achieved pain free lifting of groceries and reports she still limits what she picks up due to pain (unable to lift water bottles due to pain). Most of session today spent on reviewing body mechanics for lifting from an elevated surface, tolerated nicely and intermittent rests as needed. Introduced glute bridging with posterior pelvic tilts as well and she demo'd ability to ascend partial range with good form and decreased eccentric control.  She will benefit from continued hip and core strengthening as well as functonal  mobility training.    Rehab Potential Good    PT Frequency 2x / week    PT Duration 8 weeks    PT Treatment/Interventions ADLs/Self Care Home Management;Electrical Stimulation;Moist Heat;Neuromuscular re-education;Therapeutic exercise;Therapeutic activities;Patient/family education;Manual techniques;Dry needling    PT Next Visit Plan Progress strengthening and stability exercises as toelrated. Incorporate functional mobility training as tolerated for lifting.    PT Home Exercise Plan Pelvic tilts, bridges    Consulted and Agree with Plan of Care Patient           Patient will benefit from skilled therapeutic intervention in order to improve the following deficits and impairments:  Decreased range of motion,Pain,Impaired flexibility,Improper body mechanics,Postural dysfunction,Decreased strength,Difficulty walking,Increased muscle spasms  Visit Diagnosis: Muscle spasm of back  Acute bilateral low back pain without sciatica     Problem List There are no problems to display for this patient.   Hall Busing, PT, DPT 03/07/2020, 5:55 PM  [address]. [place], [place], [address] Phone: [phone]   Fax:  [phone]  Name: Alexandra Barton MRN: [id number] Date of Birth: [birth year]

## 2020-03-12 ENCOUNTER — Ambulatory Visit: Payer: PRIVATE HEALTH INSURANCE | Admitting: Physical Therapy

## 2020-03-12 ENCOUNTER — Other Ambulatory Visit: Payer: Self-pay

## 2020-03-12 ENCOUNTER — Encounter: Payer: Self-pay | Admitting: Physical Therapy

## 2020-03-12 DIAGNOSIS — M62830 Muscle spasm of back: Secondary | ICD-10-CM

## 2020-03-12 DIAGNOSIS — M545 Low back pain, unspecified: Secondary | ICD-10-CM

## 2020-03-12 NOTE — Therapy (Signed)
[place]. [place], [place], [address] Phone: [phone]   Fax:  [phone]  Physical Therapy Treatment  Patient Details  Name: Alexandra Barton MRN: [id number] Date of Birth: Jul 19, 1995 Referring Provider (PT): Berta Minor   Encounter Date: 03/12/2020   PT End of Session - 03/12/20 1513    Visit Number 12    Date for PT Re-Evaluation 04/04/19    PT Start Time 1430    PT Stop Time 1513    PT Time Calculation (min) 43 min    Activity Tolerance Patient tolerated treatment well    Behavior During Therapy [facility] for tasks assessed/performed           History reviewed. No pertinent past medical history.  History reviewed. No pertinent surgical history.  There were no vitals filed for this visit.   Subjective Assessment - 03/12/20 1431    Subjective "Im good"    Currently in Pain? No/denies                             [facility] Adult PT Treatment/Exercise - 03/12/20 0001      Lumbar Exercises: Aerobic   UBE (Upper Arm Bike) L2 x 4 min    Nustep L5 x6 min      Lumbar Exercises: Machines for Strengthening   Cybex Knee Extension 10lb 2x15    Cybex Knee Flexion 25lb 2x10    Other Lumbar Machine Exercise Rows & Lats 25lb 2x15      Lumbar Exercises: Standing   Row Theraband;20 reps;Both;Strengthening    Theraband Level (Row) Level 4 (Blue)    Shoulder Extension 20 reps;Both;Strengthening;Theraband    Theraband Level (Shoulder Extension) Level 4 (Blue)      Lumbar Exercises: Supine   Bridge Compliant;15 reps;2 seconds                    PT Short Term Goals - 02/06/20 1509      PT SHORT TERM GOAL #1   Title independent with initial HEP    Status Achieved             PT Long Term Goals - 03/12/20 1439      PT LONG TERM GOAL #1   Title understand posture and body mechanics    Status Achieved      PT LONG TERM GOAL #2   Title increase lumbar side bending to WNL's    Status  Achieved      PT LONG TERM GOAL #3   Title pain decreased 50%    Status Achieved      PT LONG TERM GOAL #4   Title be able to lift groceries without pain    Status On-going                 Plan - 03/12/20 1514    Clinical Impression Statement Pt enters clinic reporting she is feeling well. She has met most goals but continues to have difficulty carrying heavier groceries such as packets of water and sodas. Despite this limitation she is pleased with her current functional status and stated she feels good about moving on without therapy. she completed all interventions well but required frequent rest due to fatigue. LE did fatigue quick often times stopping during sets to rest. Mod cues needed to engage core with supine bridge.    Stability/Clinical Decision Making Stable/Uncomplicated    Rehab Potential Good  PT Frequency 2x / week    PT Duration 8 weeks    PT Treatment/Interventions ADLs/Self Care Home Management;Electrical Stimulation;Moist Heat;Neuromuscular re-education;Therapeutic exercise;Therapeutic activities;Patient/family education;Manual techniques;Dry needling    PT Next Visit Plan D/C PT           Patient will benefit from skilled therapeutic intervention in order to improve the following deficits and impairments:  Decreased range of motion,Pain,Impaired flexibility,Improper body mechanics,Postural dysfunction,Decreased strength,Difficulty walking,Increased muscle spasms  Visit Diagnosis: Acute bilateral low back pain without sciatica  Muscle spasm of back     Problem List There are no problems to display for this patient.  PHYSICAL THERAPY DISCHARGE SUMMARY  Visits from Start of Care: 12  Plan: Patient agrees to discharge.  Patient goals were partially met. Patient is being discharged due to being pleased with the current functional level.  ?????    Amador Cunas, PT, DPT Scot Jun, PTA 03/12/2020, 3:21 PM  [place]. [place], [place], [address] Phone: [phone]   Fax:  [phone]  Name: Courtland Coppa MRN: [id number] Date of Birth: 04-21-1995

## 2020-03-14 ENCOUNTER — Ambulatory Visit: Payer: PRIVATE HEALTH INSURANCE | Admitting: Physical Therapy

## 2020-03-19 ENCOUNTER — Ambulatory Visit: Payer: PRIVATE HEALTH INSURANCE | Admitting: Physical Therapy

## 2020-03-21 ENCOUNTER — Ambulatory Visit: Payer: PRIVATE HEALTH INSURANCE
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
## 2023-07-18 ENCOUNTER — Emergency Department (HOSPITAL_BASED_OUTPATIENT_CLINIC_OR_DEPARTMENT_OTHER)
Admission: EM | Admit: 2023-07-18 | Discharge: 2023-07-18 | Disposition: A | Discharge status: Home / Self Care | Attending: Emergency Medicine | Admitting: Emergency Medicine

## 2023-07-18 ENCOUNTER — Other Ambulatory Visit: Payer: Self-pay

## 2023-07-18 ENCOUNTER — Encounter (HOSPITAL_BASED_OUTPATIENT_CLINIC_OR_DEPARTMENT_OTHER): Payer: Self-pay | Admitting: *Deleted

## 2023-07-18 DIAGNOSIS — F10120 Alcohol abuse with intoxication, uncomplicated: Secondary | ICD-10-CM | POA: Diagnosis not present

## 2023-07-18 DIAGNOSIS — F10920 Alcohol use, unspecified with intoxication, uncomplicated: Secondary | ICD-10-CM

## 2023-07-18 DIAGNOSIS — R202 Paresthesia of skin: Secondary | ICD-10-CM | POA: Insufficient documentation

## 2023-07-18 NOTE — Discharge Instructions (Signed)
 Please follow-up with orthopedics for further evaluation of your finger.
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
## 2023-08-21 ENCOUNTER — Emergency Department (HOSPITAL_BASED_OUTPATIENT_CLINIC_OR_DEPARTMENT_OTHER)
Admission: EM | Admit: 2023-08-21 | Discharge: 2023-08-21 | Discharge status: Against Medical Advice | Attending: Emergency Medicine | Admitting: Emergency Medicine

## 2023-08-21 ENCOUNTER — Encounter (HOSPITAL_BASED_OUTPATIENT_CLINIC_OR_DEPARTMENT_OTHER): Payer: Self-pay

## 2023-08-21 ENCOUNTER — Emergency Department (HOSPITAL_BASED_OUTPATIENT_CLINIC_OR_DEPARTMENT_OTHER)

## 2023-08-21 ENCOUNTER — Other Ambulatory Visit: Payer: Self-pay

## 2023-08-21 DIAGNOSIS — W503XXA Accidental bite by another person, initial encounter: Secondary | ICD-10-CM | POA: Diagnosis not present

## 2023-08-21 DIAGNOSIS — S60410A Abrasion of right index finger, initial encounter: Secondary | ICD-10-CM | POA: Diagnosis present

## 2023-08-21 DIAGNOSIS — Z5321 Procedure and treatment not carried out due to patient leaving prior to being seen by health care provider: Secondary | ICD-10-CM | POA: Insufficient documentation

## 2023-08-21 NOTE — ED Notes (Signed)
 Called for imaging, no answer.
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
